# Patient Record
Sex: Female | Born: 2000 | Hispanic: Yes | Smoking: Never smoker
Health system: Southern US, Community
[De-identification: ages and names within clinical notes are randomized; demographics above are authoritative.]

## PROBLEM LIST (undated history)

## (undated) HISTORY — PX: OTHER SURGICAL HISTORY: SHX169

---
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
## 2021-05-15 IMAGING — US US OB LIMITED
2 series · 14 of 28 positions shown · non-contrast
Comparison: none

CLINICAL DATA: Fall, abdominal pain, 23 weeks pregnant.

EXAM:
LIMITED OBSTETRIC ULTRASOUND

[Series 1: us ob limited · 8 of 21 slices shown (1 of 2)]
[im 2/21]
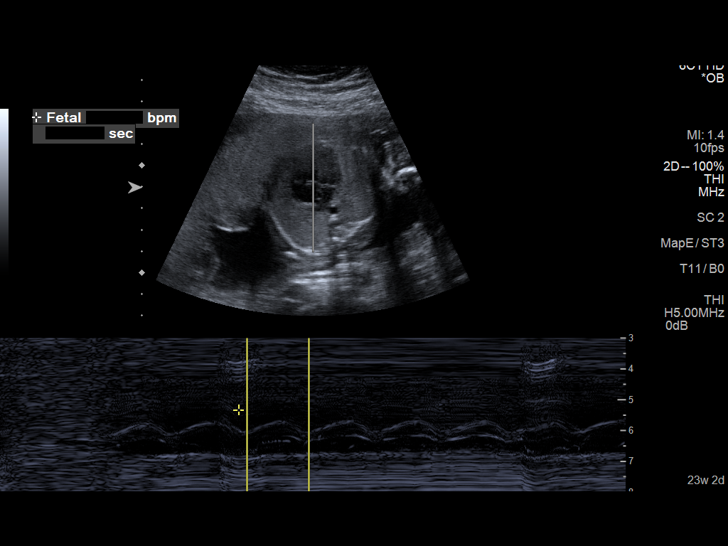
[im 4/21]
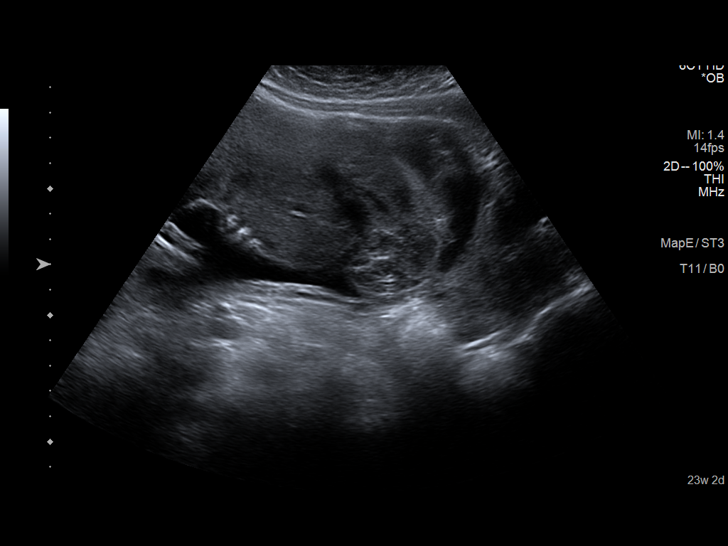
[im 7/21]
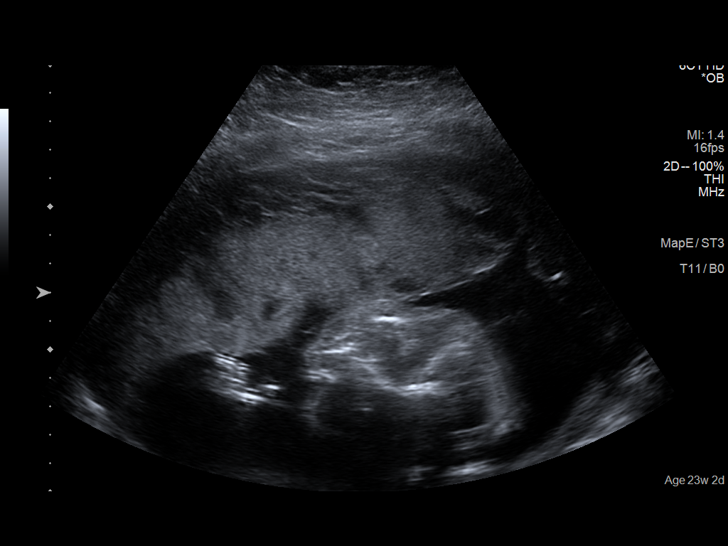
[im 9/21]
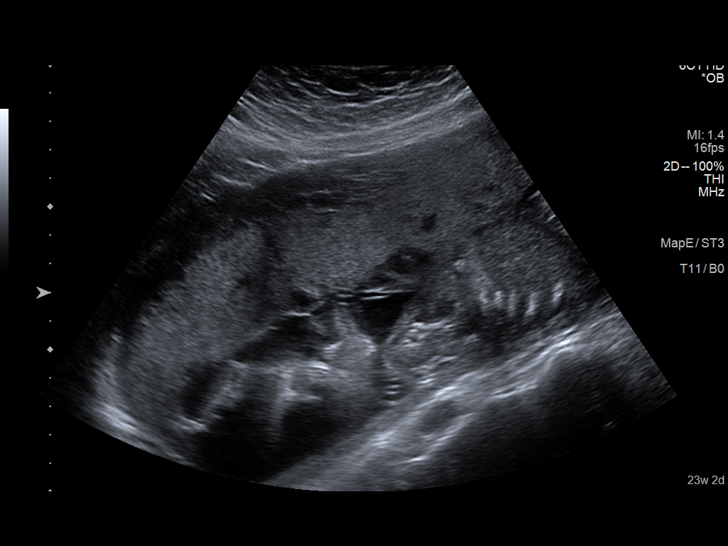
[im 12/21]
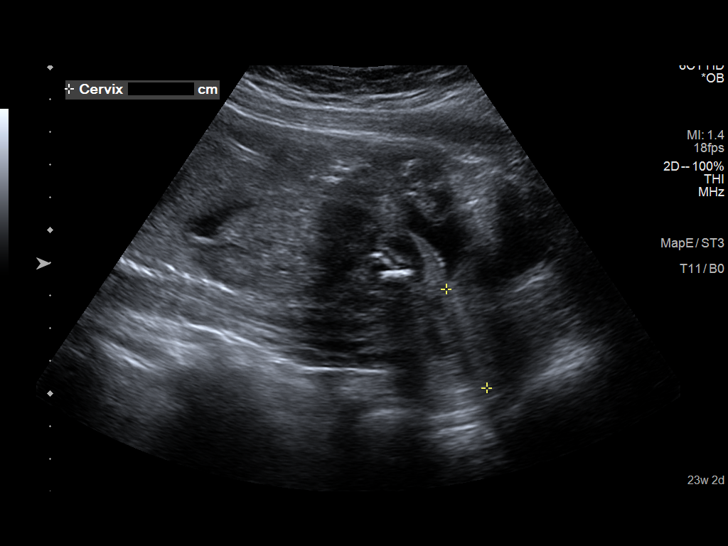
[im 14/21]
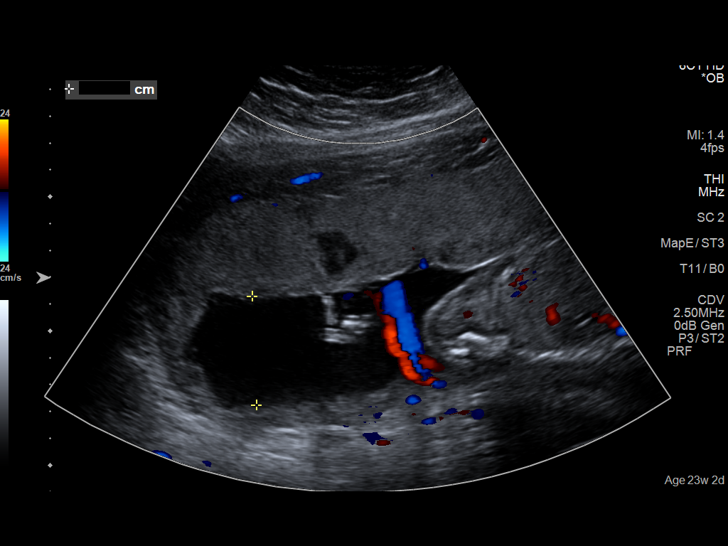
[im 17/21]
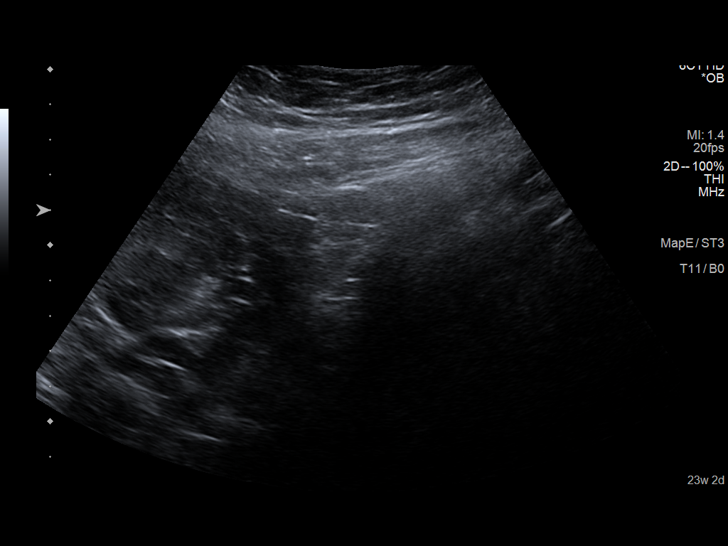
[im 19/21]
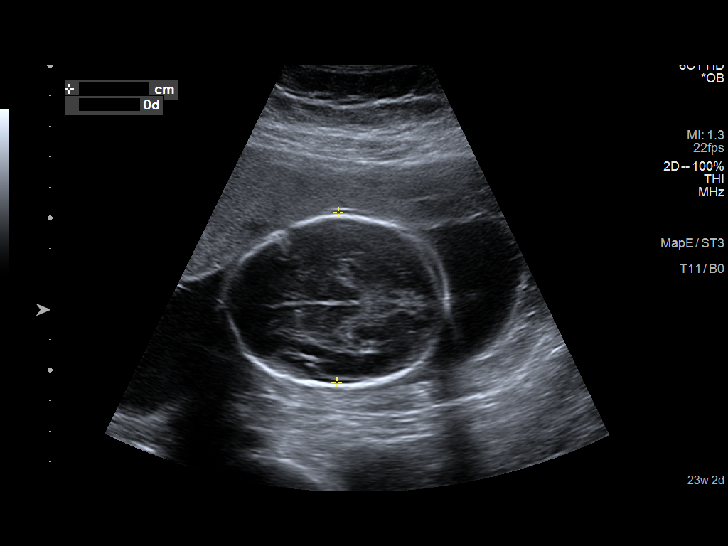

[Series 1001: us ob limited · 0.23mm/px · 6 of 14 slices shown (2 of 2)]
[im 1/14]
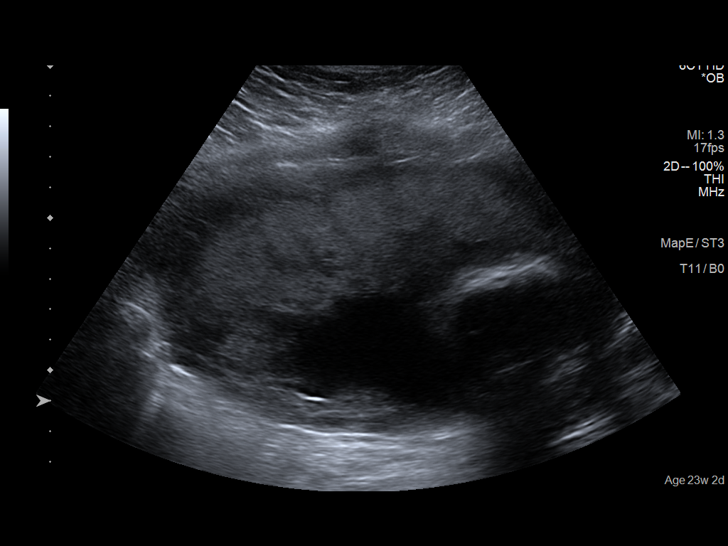
[im 3/14]
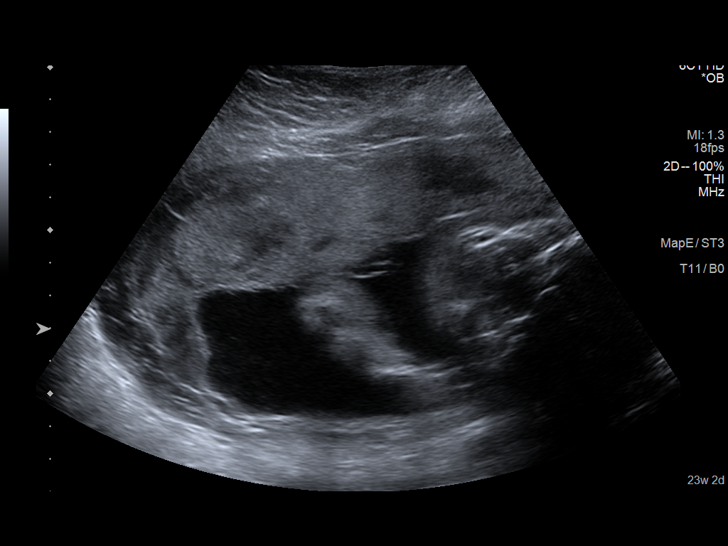
[im 6/14]
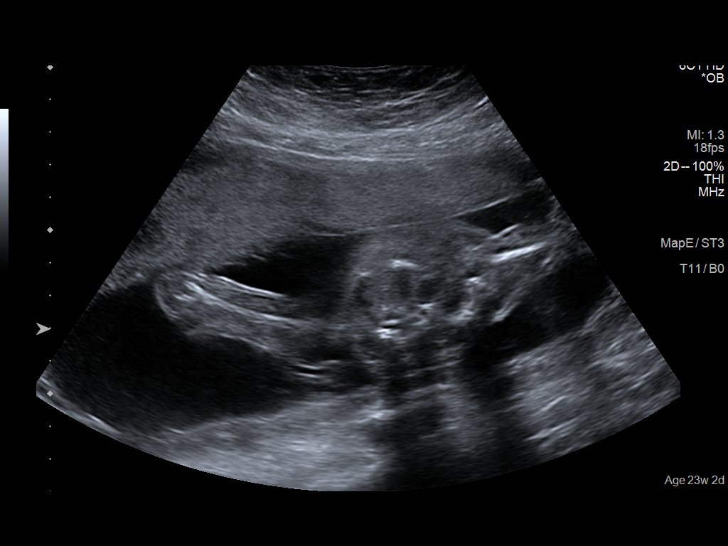
[im 8/14]
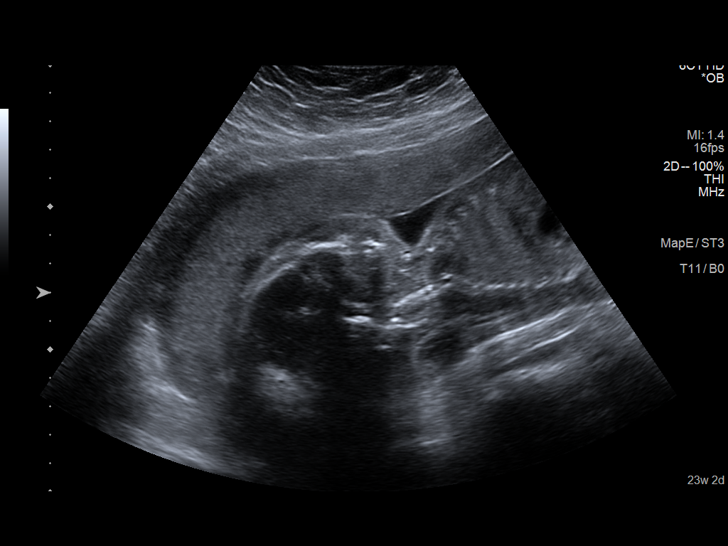
[im 11/14]
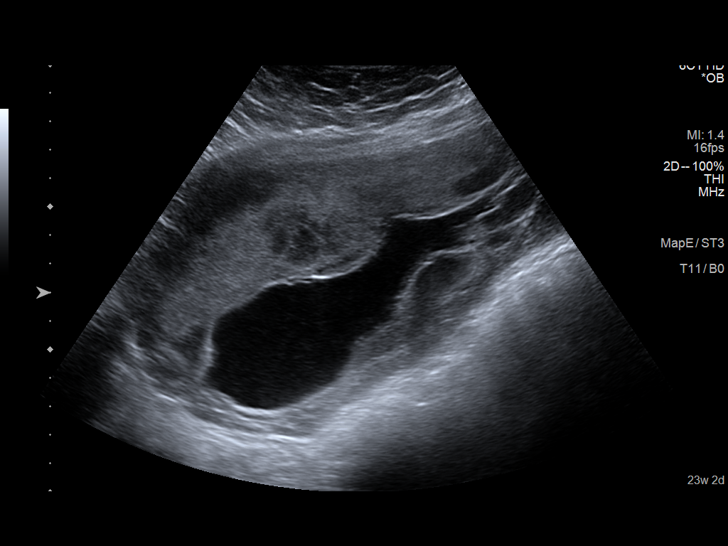
[im 14/14]
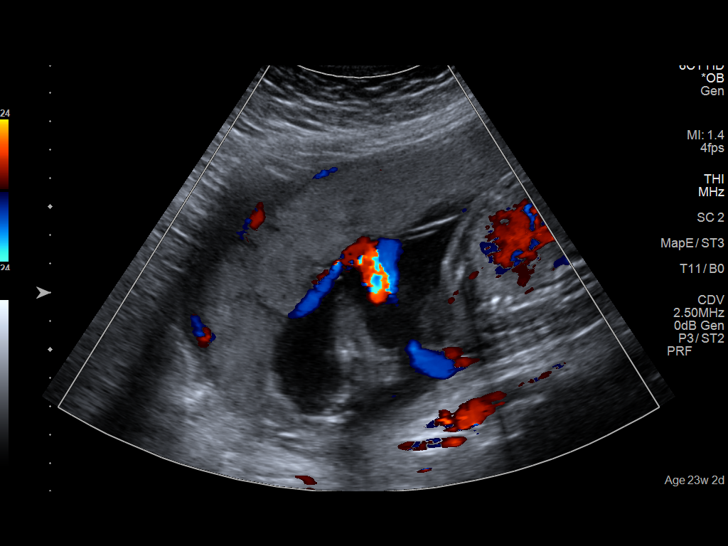

[14 of 28 positions shown; findings below may reference images not displayed]

FINDINGS: Number of Fetuses: 1

Heart Rate:  165 bpm

Movement: Present

Presentation: Breech

Placental Location: Anterior

Previa: None

Amniotic Fluid (Subjective):  Within normal limits.

AFI: Not recorded cm

BPD: 5.6 cm 23 w  1 d

MATERNAL FINDINGS:

Cervix:  Closed measuring 3.3 cm

Uterus/Adnexae: No gross abnormality
IMPRESSION: Normal limited OB exam.

This exam is performed on an emergent basis and does not
comprehensively evaluate fetal size, dating, or anatomy; follow-up
complete OB US should be considered if further fetal assessment is
warranted.

## 2022-08-12 ENCOUNTER — Other Ambulatory Visit: Payer: Self-pay | Admitting: Primary Care

## 2022-08-12 ENCOUNTER — Other Ambulatory Visit (HOSPITAL_COMMUNITY): Payer: Self-pay | Admitting: Primary Care

## 2022-08-12 DIAGNOSIS — Z3481 Encounter for supervision of other normal pregnancy, first trimester: Secondary | ICD-10-CM

## 2022-08-25 ENCOUNTER — Other Ambulatory Visit: Payer: Self-pay | Admitting: Primary Care

## 2022-08-25 ENCOUNTER — Ambulatory Visit
Admission: RE | Admit: 2022-08-25 | Discharge: 2022-08-25 | Disposition: A | Discharge status: Home / Self Care | Payer: Medicaid Other | Source: Ambulatory Visit | Attending: Primary Care | Admitting: Primary Care

## 2022-08-25 DIAGNOSIS — Z3687 Encounter for antenatal screening for uncertain dates: Secondary | ICD-10-CM | POA: Insufficient documentation

## 2022-08-25 DIAGNOSIS — Z3481 Encounter for supervision of other normal pregnancy, first trimester: Secondary | ICD-10-CM | POA: Insufficient documentation

## 2022-08-25 DIAGNOSIS — Z3A13 13 weeks gestation of pregnancy: Secondary | ICD-10-CM | POA: Diagnosis not present

## 2022-09-09 ENCOUNTER — Other Ambulatory Visit: Payer: Self-pay | Admitting: Primary Care

## 2022-09-09 DIAGNOSIS — Z3482 Encounter for supervision of other normal pregnancy, second trimester: Secondary | ICD-10-CM

## 2022-09-17 ENCOUNTER — Ambulatory Visit: Payer: Medicaid Other

## 2022-10-15 ENCOUNTER — Ambulatory Visit
Admission: RE | Admit: 2022-10-15 | Discharge: 2022-10-15 | Disposition: A | Discharge status: Home / Self Care | Payer: Medicaid Other | Source: Ambulatory Visit | Attending: Primary Care | Admitting: Primary Care

## 2022-10-15 DIAGNOSIS — Z3689 Encounter for other specified antenatal screening: Secondary | ICD-10-CM | POA: Insufficient documentation

## 2022-10-15 DIAGNOSIS — Z3482 Encounter for supervision of other normal pregnancy, second trimester: Secondary | ICD-10-CM

## 2022-10-15 DIAGNOSIS — Z3A20 20 weeks gestation of pregnancy: Secondary | ICD-10-CM | POA: Diagnosis not present

## 2022-12-20 NOTE — L&D Delivery Note (Signed)
Delivery Note  First Stage: Labor onset: 0400 Augmentation : AROM Analgesia /Anesthesia intrapartum: epidural AROM at 1619  Second Stage: Complete dilation at 1850 Onset of pushing at 1852 FHR second stage Category II, recurrent variable decels  Delivery of a viable female infant 02/27/2023 at [place] by Lucrezia Europe, CNM. delivery of fetal head in OA position with restitution to LOT. No nuchal cord;  Anterior then posterior shoulders delivered easily with gentle downward traction. Baby placed on mom's chest, and attended to by peds.  Cord double clamped after cessation of pulsation, cut by FOB Cord blood sample collected   Third Stage: Retained placenta. Attempted oxytocin bolus then stopped the oxytocin bolus. Unable to deliver the placenta with gentle traction or a fundal rub. At 30 minutes without placenta delivery, called Dr. Glennon Mac and requested he come evaluate for manual removal of the placenta. Placenta delivered manually intact by Dr. Glennon Mac with 3 VC @ 2004. Dr. Glennon Mac performed bedside US after manual removal. Placenta disposition: sent to pathology d/t manual removal Uterine tone firm / bleeding moderate then light. Oxytocin bolus initiated and cytotec placed rectally.  1st degree laceration identified  Anesthesia for repair: n/a Repair: none needed, laceration hemostatic and approximated Est. Blood Loss (mL): [id number]  Complications: retained placenta  Mom to postpartum.  Baby to Couplet care / Skin to Skin.  Newborn: Birth Weight: 8lb 2.2oz  Apgar Scores: 9, 9 Feeding planned: breast and bottle

## 2023-02-27 ENCOUNTER — Inpatient Hospital Stay: Payer: Medicaid Other | Admitting: Anesthesiology

## 2023-02-27 ENCOUNTER — Other Ambulatory Visit: Payer: Self-pay

## 2023-02-27 ENCOUNTER — Inpatient Hospital Stay
Admission: EM | Admit: 2023-02-27 | Discharge: 2023-03-01 | DRG: 797 | Disposition: A | Discharge status: Home / Self Care | Payer: Medicaid Other

## 2023-02-27 DIAGNOSIS — O9902 Anemia complicating childbirth: Secondary | ICD-10-CM | POA: Diagnosis present

## 2023-02-27 DIAGNOSIS — Z3A39 39 weeks gestation of pregnancy: Secondary | ICD-10-CM | POA: Diagnosis not present

## 2023-02-27 DIAGNOSIS — O99214 Obesity complicating childbirth: Secondary | ICD-10-CM | POA: Diagnosis present

## 2023-02-27 DIAGNOSIS — D62 Acute posthemorrhagic anemia: Secondary | ICD-10-CM | POA: Diagnosis not present

## 2023-02-27 DIAGNOSIS — O479 False labor, unspecified: Secondary | ICD-10-CM | POA: Diagnosis present

## 2023-02-27 DIAGNOSIS — O26893 Other specified pregnancy related conditions, third trimester: Secondary | ICD-10-CM | POA: Diagnosis present

## 2023-02-27 LAB — CBC
HCT: 34.4 % — ABNORMAL LOW (ref 36.0–46.0)
Hemoglobin: 11.2 g/dL — ABNORMAL LOW (ref 12.0–15.0)
MCH: 26.5 pg (ref 26.0–34.0)
MCHC: 32.6 g/dL (ref 30.0–36.0)
MCV: 81.3 fL (ref 80.0–100.0)
Platelets: 210 10*3/uL (ref 150–400)
RBC: 4.23 MIL/uL (ref 3.87–5.11)
RDW: 14.6 % (ref 11.5–15.5)
WBC: 9.4 10*3/uL (ref 4.0–10.5)
nRBC: 0 % (ref 0.0–0.2)

## 2023-02-27 LAB — TYPE AND SCREEN
ABO/RH(D): O POS
Antibody Screen: NEGATIVE

## 2023-02-27 MED ORDER — EPHEDRINE 5 MG/ML INJ: 10.0000 mg | INTRAVENOUS | Status: DC | PRN

## 2023-02-27 MED ORDER — FENTANYL CITRATE (PF) 100 MCG/2ML IJ SOLN: 50.0000 ug | INTRAMUSCULAR | Status: DC | PRN

## 2023-02-27 MED ORDER — OXYTOCIN-SODIUM CHLORIDE 30-0.9 UT/500ML-% IV SOLN
2.5000 [IU]/h | INTRAVENOUS | Status: DC
  Filled 2023-02-27: qty 500

## 2023-02-27 MED ORDER — ONDANSETRON HCL 4 MG PO TABS: 4.0000 mg | ORAL_TABLET | ORAL | Status: DC | PRN

## 2023-02-27 MED ORDER — ONDANSETRON HCL 4 MG/2ML IJ SOLN: 4.0000 mg | INTRAMUSCULAR | Status: DC | PRN

## 2023-02-27 MED ORDER — DIPHENHYDRAMINE HCL 50 MG/ML IJ SOLN: 12.5000 mg | INTRAMUSCULAR | Status: DC | PRN

## 2023-02-27 MED ORDER — LACTATED RINGERS IV SOLN: INTRAVENOUS | Status: DC

## 2023-02-27 MED ORDER — ACETAMINOPHEN 325 MG PO TABS: 650.0000 mg | ORAL_TABLET | ORAL | Status: DC | PRN

## 2023-02-27 MED ORDER — BENZOCAINE-MENTHOL 20-0.5 % EX AERO
1.0000 | INHALATION_SPRAY | CUTANEOUS | Status: DC | PRN
  Administered 2023-02-27: 1 via TOPICAL
  Filled 2023-02-27: qty 56

## 2023-02-27 MED ORDER — WITCH HAZEL-GLYCERIN EX PADS
1.0000 | MEDICATED_PAD | CUTANEOUS | Status: DC | PRN
  Administered 2023-02-27: 1 via TOPICAL
  Filled 2023-02-27: qty 100

## 2023-02-27 MED ORDER — PRENATAL MULTIVITAMIN CH
1.0000 | ORAL_TABLET | Freq: Every day | ORAL | Status: DC
  Administered 2023-02-28 – 2023-03-01 (×2): 1 via ORAL
  Filled 2023-02-27 (×2): qty 1

## 2023-02-27 MED ORDER — OXYTOCIN 10 UNIT/ML IJ SOLN
INTRAMUSCULAR | Status: AC
  Filled 2023-02-27: qty 2

## 2023-02-27 MED ORDER — MISOPROSTOL 200 MCG PO TABS
ORAL_TABLET | ORAL | Status: AC
  Filled 2023-02-27: qty 4

## 2023-02-27 MED ORDER — MISOPROSTOL 200 MCG PO TABS
800.0000 ug | ORAL_TABLET | Freq: Once | ORAL | Status: AC
  Administered 2023-02-27: 800 ug via RECTAL

## 2023-02-27 MED ORDER — FENTANYL-BUPIVACAINE-NACL 0.5-0.125-0.9 MG/250ML-% EP SOLN
EPIDURAL | Status: AC
  Filled 2023-02-27: qty 250

## 2023-02-27 MED ORDER — ZOLPIDEM TARTRATE 5 MG PO TABS: 5.0000 mg | ORAL_TABLET | Freq: Every evening | ORAL | Status: DC | PRN

## 2023-02-27 MED ORDER — PHENYLEPHRINE 80 MCG/ML (10ML) SYRINGE FOR IV PUSH (FOR BLOOD PRESSURE SUPPORT): 80.0000 ug | PREFILLED_SYRINGE | INTRAVENOUS | Status: DC | PRN

## 2023-02-27 MED ORDER — SOD CITRATE-CITRIC ACID 500-334 MG/5ML PO SOLN: 30.0000 mL | ORAL | Status: DC | PRN

## 2023-02-27 MED ORDER — OXYTOCIN BOLUS FROM INFUSION
333.0000 mL | Freq: Once | INTRAVENOUS | Status: AC
  Administered 2023-02-27: 333 mL via INTRAVENOUS

## 2023-02-27 MED ORDER — IBUPROFEN 600 MG PO TABS
600.0000 mg | ORAL_TABLET | Freq: Four times a day (QID) | ORAL | Status: DC
  Administered 2023-02-27 – 2023-03-01 (×6): 600 mg via ORAL
  Filled 2023-02-27 (×6): qty 1

## 2023-02-27 MED ORDER — AMMONIA AROMATIC IN INHA
RESPIRATORY_TRACT | Status: AC
  Filled 2023-02-27: qty 10

## 2023-02-27 MED ORDER — LIDOCAINE HCL (PF) 1 % IJ SOLN: 30.0000 mL | INTRAMUSCULAR | Status: DC | PRN

## 2023-02-27 MED ORDER — LIDOCAINE HCL (PF) 1 % IJ SOLN
INTRAMUSCULAR | Status: AC
  Filled 2023-02-27: qty 30

## 2023-02-27 MED ORDER — DIBUCAINE (PERIANAL) 1 % EX OINT: 1.0000 | TOPICAL_OINTMENT | CUTANEOUS | Status: DC | PRN

## 2023-02-27 MED ORDER — LACTATED RINGERS IV SOLN: 500.0000 mL | INTRAVENOUS | Status: DC | PRN

## 2023-02-27 MED ORDER — LIDOCAINE HCL (PF) 1 % IJ SOLN
INTRAMUSCULAR | Status: DC | PRN
  Administered 2023-02-27: 3 mL via SUBCUTANEOUS

## 2023-02-27 MED ORDER — COCONUT OIL OIL: 1.0000 | TOPICAL_OIL | Status: DC | PRN

## 2023-02-27 MED ORDER — SODIUM CHLORIDE 0.9 % IV SOLN
INTRAVENOUS | Status: DC | PRN
  Administered 2023-02-27: 5 mL via EPIDURAL
  Administered 2023-02-27: 2 mL via EPIDURAL

## 2023-02-27 MED ORDER — SIMETHICONE 80 MG PO CHEW: 80.0000 mg | CHEWABLE_TABLET | ORAL | Status: DC | PRN

## 2023-02-27 MED ORDER — ONDANSETRON HCL 4 MG/2ML IJ SOLN: 4.0000 mg | Freq: Four times a day (QID) | INTRAMUSCULAR | Status: DC | PRN

## 2023-02-27 MED ORDER — FENTANYL-BUPIVACAINE-NACL 0.5-0.125-0.9 MG/250ML-% EP SOLN
12.0000 mL/h | EPIDURAL | Status: DC | PRN
  Administered 2023-02-27: 12 mL/h via EPIDURAL

## 2023-02-27 MED ORDER — DIPHENHYDRAMINE HCL 25 MG PO CAPS: 25.0000 mg | ORAL_CAPSULE | Freq: Four times a day (QID) | ORAL | Status: DC | PRN

## 2023-02-27 MED ORDER — LACTATED RINGERS IV SOLN
500.0000 mL | Freq: Once | INTRAVENOUS | Status: AC
  Administered 2023-02-27: 500 mL via INTRAVENOUS

## 2023-02-27 MED ORDER — SENNOSIDES-DOCUSATE SODIUM 8.6-50 MG PO TABS
2.0000 | ORAL_TABLET | Freq: Every day | ORAL | Status: DC
  Administered 2023-02-28 – 2023-03-01 (×2): 2 via ORAL
  Filled 2023-02-27 (×2): qty 2

## 2023-02-27 MED ORDER — LIDOCAINE-EPINEPHRINE (PF) 1.5 %-1:200000 IJ SOLN
INTRAMUSCULAR | Status: DC | PRN
  Administered 2023-02-27: 3 mL via EPIDURAL
  Administered 2023-02-27: 2 mL via EPIDURAL

## 2023-02-27 NOTE — Progress Notes (Signed)
Called by midwife due to retained placenta.  Oxytocin already running.  Epidural bolused.   Minimal bleeding on exam. Some placenta in vaginal vault.  Explained to patient need to manually remove placenta. She voiced understanding and agreement. Right hand inserted into uterus.  Placenta palpated anterior and plan easily created.  Bandl's band noted. Placenta partially delivered through band and then hand extended a little further to remove rest of placenta.  Entire placenta removed at this point.   Initially inspection of placenta showed that it appeared intact.  Bedside ultrasound showed thin endometrial lining with some clots in lower uterine segment/cervix.  Hand placed again in lower uterine segment with only small clot removed. Entire uterus again swept with hand and no placenta palpated. Contour palpated normal without irregularities.   Given minimal lochia and no evidence of further retained placenta, terminated procedure.  Misoprostol 800 mcg placed rectally after discussing reason with patient and obtaining consent.   Inspected the placenta again and it appeared intact, though ripped in parts, but able to reassemble (entire placenta in one piece, but separated).   Patient doing well and only a small first degree noted, which will be repaired by Jayme Cloud, as indicated.  Prentice Docker, MD, [facility] 02/27/2023 8:18 PM

## 2023-02-27 NOTE — Discharge Summary (Signed)
Obstetrical Discharge Summary  Patient Name: Natalie Munoz DOB: Apr 05, 2001 MRN: [id number]  Date of Admission: 02/27/2023 Date of Delivery: 02/27/23 Delivered by: Natalie Munoz Date of Discharge: 03/01/2023  Primary OB: Natalie Munoz  LMP:No LMP recorded. EDC Estimated Date of Delivery: 03/02/23 Gestational Age at Delivery: [redacted]w[redacted]d   Antepartum complications:  1. Obesity 2. Anemia 3. Transfer from Natalie Munoz Admitting Diagnosis: uterine contractions Secondary Diagnosis: NSVD, retained placenta Patient Active Problem List   Diagnosis Date Noted   Uterine contractions 02/27/2023   NSVD (normal spontaneous vaginal delivery) 02/27/2023   Retained placenta 02/27/2023   Irregular uterine contractions 01/13/2020   Leakage of amniotic fluid 01/13/2020   Rupture of membranes with delay of delivery 01/13/2020    Augmentation: AROM Complications: None Intrapartum complications/course: She arrived with painful uterine contractions and was 7/90/-1. She received an epidural and AROM and progressed to 10/100/+3 and pushed 4 minutes, delivering viable female infant over 1st degree laceration. Retained placenta x , removed manually by Natalie Munoz. Date of Delivery: 02/27/23 Delivered By: Natalie Munoz Delivery Type: spontaneous vaginal delivery Anesthesia: epidural Placenta: spontaneous Laceration: 1st degree, hemostatic and approximated, no need for repair Episiotomy: none Newborn Data: Live born female "Natalie Munoz" Birth Weight: 8 lb 2.2 oz (3690 g) APGAR: 9, 9  Newborn Delivery   Birth date/time: 02/27/2023 18:56:00 Delivery type: Vaginal, Spontaneous      Postpartum Procedures: None Edinburgh:     02/27/2023   10:50 PM 01/14/2020    3:13 AM  Edinburgh Postnatal Depression Scale Screening Tool  I have been able to laugh and see the funny side of things. 0 0  I have looked forward with enjoyment to things. 0 0  I have blamed myself unnecessarily  when things went wrong. 1 2  I have been anxious or worried for no good reason. 0 1  I have felt scared or panicky for no good reason. 0 1  Things have been getting on top of me. 0 1  I have been so unhappy that I have had difficulty sleeping. 0 0  I have felt sad or miserable. 0 2  I have been so unhappy that I have been crying. 0 2  The thought of harming myself has occurred to me. 0 0  Edinburgh Postnatal Depression Scale Total 1 9      Post partum course:  Patient had an uncomplicated postpartum course.  By time of discharge on PPD#1, her pain was controlled on oral pain medications; she had appropriate lochia and was ambulating, voiding without difficulty and tolerating regular diet.  She was deemed stable for discharge to home.    Discharge Physical Exam:  BP 109/76   Pulse 93   Temp 98.3 F (36.8 C) (Oral)   Resp 20   Ht 5' (1.524 m)   Wt 94.3 kg   SpO2 97%   Breastfeeding Unknown   BMI 40.60 kg/m   General: NAD CV: RRR Pulm: CTABL, nl effort ABD: s/nd/nt, fundus firm and below the umbilicus Lochia: moderate Perineum: well approximated/intact DVT Evaluation: LE non-ttp, no evidence of DVT on exam.  Hemoglobin  Date Value Ref Range Status  02/28/2023 10.1 (L) 12.0 - 15.0 g/dL Final   HCT  Date Value Ref Range Status  02/28/2023 30.5 (L) 36.0 - 46.0 % Final     Disposition: stable, discharge to home. Baby Feeding: breastmilk and formula Baby Disposition: home with mom  Rh Immune globulin given: n/a, O pos Rubella vaccine given: n/a, immune Varicella  vaccine given: n/a, immune Tdap vaccine given in AP or PP setting: received prenatally Flu vaccine given in AP or PP setting: received prenatally  Contraception: Liletta IUD  Prenatal Labs:  Blood type/Rh O pos  Antibody screen neg  Rubella Immune  Varicella Immune  RPR NR  HBsAg Neg  HIV NR  GC neg  Chlamydia neg  Genetic screening negative  1 hour GTT 135  3 hour GTT [phone]   GBS  Negative     Plan:  Natalie Munoz was discharged to home in good condition. Follow-up appointment with delivering provider in 6 weeks.  Discharge Medications: Allergies as of 03/01/2023   No Known Allergies      Medication List     TAKE these medications    acetaminophen 500 MG tablet Commonly known as: TYLENOL Take 2 tablets (1,000 mg total) by mouth every 6 (six) hours as needed (for pain scale < 4).   docusate sodium 100 MG capsule Commonly known as: COLACE Take 1 capsule (100 mg total) by mouth 2 (two) times daily.   ferrous sulfate 325 (65 FE) MG tablet Take 1 tablet (325 mg total) by mouth 2 (two) times daily with a meal. For anemia, take with Vitamin C   ibuprofen 600 MG tablet Commonly known as: ADVIL Take 1 tablet (600 mg total) by mouth every 6 (six) hours.   Prenatal Vitamin 27-0.8 MG Tabs Take 1 tablet by mouth daily at 6 (six) AM.         Follow-up Information     Natalie Munoz, Munoz Follow up in 6 week(s).   Specialty: Certified Nurse Midwife Why: 6wk postpartum and IUD insertion Contact information: [address] [place] [address] [phone]                 Signed:  Al Munoz 03/01/2023 8:37 AM  Natalie Munoz, Munoz Certified Nurse Midwife [facility]

## 2023-02-27 NOTE — H&P (Signed)
OB History & Physical   History of Present Illness:  Chief Complaint:   HPI:  Natalie Munoz is a 22 y.o. G48P1001 female at 13w4ddated by LMP.  She presents to L&D for uterine contractions that started at 0400. She reports ? Leaking fluid for about one hour. Reports good fetal movement.   Pregnancy Issues: 1. Obesity 2. Anemia 3. [facility]:  No past medical history on file.  Past Surgical History:  Procedure Laterality Date   None      No Known Allergies  Prior to Admission medications   Medication Sig Start Date End Date Taking? Authorizing Provider  acetaminophen (TYLENOL) 500 MG tablet Take 2 tablets (1,000 mg total) by mouth every 6 (six) hours as needed (for pain scale < 4). 01/15/20   McVey, RMurray Hodgkins CNM  docusate sodium (COLACE) 100 MG capsule Take 1 capsule (100 mg total) by mouth 2 (two) times daily. 01/15/20   McVey, RMurray Hodgkins CNM  ferrous sulfate 325 (65 FE) MG tablet Take 1 tablet (325 mg total) by mouth 2 (two) times daily with a meal. For anemia, take with Vitamin C 01/15/20 03/15/20  McVey, RMurray Hodgkins CNM  ibuprofen (ADVIL) 600 MG tablet Take 1 tablet (600 mg total) by mouth every 6 (six) hours. 01/15/20   McVey, RMurray Hodgkins CNM  Prenatal Vit-Fe Fumarate-FA (PRENATAL VITAMIN) 27-0.8 MG TABS Take 1 tablet by mouth daily at 6 (six) AM. 06/19/19   NCaren Macadam MD    [place]  Social History: She  reports that she has never smoked. She has never used smokeless tobacco. She reports that she does not drink alcohol and does not use drugs.  Family History: family history includes Diabetes in her mother; Hypertension in her mother.   Review of Systems: A full review of systems was performed and negative except as noted in the HPI.     Physical Exam:  Vital Signs: BP 111/70   Pulse 98  General: no acute distress.  HEENT: normocephalic, atraumatic Heart: regular rate &  rhythm.  No murmurs/rubs/gallops Lungs: clear to auscultation bilaterally, normal respiratory effort Abdomen: soft, gravid, non-tender;  EFW: 7lb Pelvic:   External: Normal external female genitalia  Cervix: 7/90/-1   Extremities: non-tender, symmetric, mild edema bilaterally.  DTRs: +2  Neurologic: Alert & oriented x 3.    No results found for this or any previous visit (from the past 24 hour(s)).  Pertinent Results:  Prenatal Labs: Blood type/Rh O pos  Antibody screen neg  Rubella Immune  Varicella Immune  RPR NR  HBsAg Neg  HIV NR  GC neg  Chlamydia neg  Genetic screening negative  1 hour GTT 135  3 hour GTT [phone]  GBS Negative   FHT: 145bpm, moderate variability, accelerations, intermittent late decelerations TOCO: contractions q2-366m, palpate moderate SVE:  7/90/-1, BBOW palpated   Cephalic by leopolds  No results found.  Assessment:  Natalie Munoz a [age].o. G2G93P1001emale at 3942w4dth uterine contractions.   Plan:  1. Admit to Labor & Delivery; consents reviewed and obtained  2. Fetal Well being  - Fetal Tracing: Category II, intermittent late decelerations, resolved with position changes - Group B Streptococcus ppx indicated: n/a, GBS negative - Presentation: vertex confirmed by SVE   3. Routine OB: - Prenatal labs reviewed, as above - Rh positive - CBC, T&S, RPR on admit - Clear fluids, IVF  4. Monitoring  of Labor -  Contractions q2-38mn, external toco in place -  Pelvis proven to 3490g -  Plan for continuous fetal monitoring  -  Maternal pain control as desired; requesting regional anesthesia - Anticipate vaginal delivery  5. Post Partum Planning: - Infant feeding: breast and formula - Contraception: Liletta IUD  [name] CNM 02/27/23 2:16 PM

## 2023-02-27 NOTE — Anesthesia Procedure Notes (Signed)
Epidural Patient location during procedure: OB Start time: 02/27/2023 3:41 PM End time: 02/27/2023 3:50 PM  Staffing Anesthesiologist: Martha Clan, MD Performed: anesthesiologist   Preanesthetic Checklist Completed: patient identified, IV checked, site marked, risks and benefits discussed, surgical consent, monitors and equipment checked, pre-op evaluation and timeout performed  Epidural Patient position: sitting Prep: ChloraPrep Patient monitoring: heart rate, continuous pulse ox and blood pressure Approach: midline Location: L3-L4 Injection technique: LOR saline  Needle:  Needle type: Tuohy  Needle gauge: 17 G Needle length: 9 cm and 9 Needle insertion depth: 6 cm Catheter type: closed end flexible Catheter size: 19 Gauge Catheter at skin depth: 11 cm Test dose: negative and 1.5% lidocaine with Epi 1:200 K  Assessment Sensory level: T10 Events: blood not aspirated, no cerebrospinal fluid, injection not painful, no injection resistance, no paresthesia and negative IV test  Additional Notes 1st attempt Pt. Evaluated and documentation done after procedure finished. Patient identified. Risks/Benefits/Options discussed with patient including but not limited to bleeding, infection, nerve damage, paralysis, failed block, incomplete pain control, headache, blood pressure changes, nausea, vomiting, reactions to medication both or allergic, itching and postpartum back pain. Confirmed with bedside nurse the patient's most recent platelet count. Confirmed with patient that they are not currently taking any anticoagulation, have any bleeding history or any family history of bleeding disorders. Patient expressed understanding and wished to proceed. All questions were answered. Sterile technique was used throughout the entire procedure. Please see nursing notes for vital signs. Test dose was given through epidural catheter and negative prior to continuing to dose epidural or start infusion.  Warning signs of high block given to the patient including shortness of breath, tingling/numbness in hands, complete motor block, or any concerning symptoms with instructions to call for help. Patient was given instructions on fall risk and not to get out of bed. All questions and concerns addressed with instructions to call with any issues or inadequate analgesia.    Patient tolerated the insertion well without immediate complications.Reason for block:procedure for pain

## 2023-02-27 NOTE — Anesthesia Preprocedure Evaluation (Addendum)
Anesthesia Evaluation  Patient identified by MRN, date of birth, ID band Patient awake    Reviewed: Allergy & Precautions, H&P , NPO status , Patient's Chart, lab work & pertinent test results  Airway Mallampati: III  TM Distance: >3 FB Neck ROM: full    Dental  (+) Chipped   Pulmonary neg pulmonary ROS          Cardiovascular Exercise Tolerance: Good (-) hypertensionnegative cardio ROS      Neuro/Psych    GI/Hepatic negative GI ROS,,,  Endo/Other    Renal/GU   negative genitourinary   Musculoskeletal   Abdominal   Peds  Hematology negative hematology ROS (+)   Anesthesia Other Findings History reviewed. No pertinent past medical history.  Past Surgical History: No date: None  BMI    Body Mass Index: 32.69 kg/m      Reproductive/Obstetrics (+) Pregnancy                             Anesthesia Physical Anesthesia Plan  ASA: 2  Anesthesia Plan: Epidural   Post-op Pain Management:    Induction:   PONV Risk Score and Plan:   Airway Management Planned:   Additional Equipment:   Intra-op Plan:   Post-operative Plan:   Informed Consent: I have reviewed the patients History and Physical, chart, labs and discussed the procedure including the risks, benefits and alternatives for the proposed anesthesia with the patient or authorized representative who has indicated his/her understanding and acceptance.       Plan Discussed with: Anesthesiologist  Anesthesia Plan Comments:         Anesthesia Quick Evaluation

## 2023-02-27 NOTE — Progress Notes (Signed)
Labor Progress Note  Natalie Munoz is a 22 y.o. G2P1001 at 34w4dby LMP admitted for active labor  Subjective: she is comfortable after her epidural, feels occasional rectal pressure  Objective: BP 99/64   Pulse 88   SpO2 100%  Notable VS details: reviewed  Fetal Assessment: FHT:  FHR: 135 bpm, variability: moderate,  accelerations:  Present,  decelerations:  Present recurrent variable decelerations with quick recovery Category/reactivity:  Category II UC:   regular, every 3 minutes SVE:    Dilation: 9.5cm  Effacement: 100%  Station:  +1  Consistency: soft  Position: anterior  Membrane status:AROM @ [id number] color: clear  Labs: Lab Results  Component Value Date   WBC 9.4 02/27/2023   HGB 11.2 (L) 02/27/2023   HCT 34.4 (L) 02/27/2023   MCV 81.3 02/27/2023   PLT 210 02/27/2023    Assessment / Plan: G2P1001 at 344w4dere with spontaneous labor  Labor:  Progressing normally Preeclampsia:  labs stable Fetal Wellbeing:  Category II Pain Control:  Epidural I/D:   GBS negative Anticipated MOD:  NSVD  DaGertie FeyCNM 02/27/2023, 6:44 PM

## 2023-02-28 LAB — CBC
HCT: 30.5 % — ABNORMAL LOW (ref 36.0–46.0)
Hemoglobin: 10.1 g/dL — ABNORMAL LOW (ref 12.0–15.0)
MCH: 26.6 pg (ref 26.0–34.0)
MCHC: 33.1 g/dL (ref 30.0–36.0)
MCV: 80.5 fL (ref 80.0–100.0)
Platelets: 203 10*3/uL (ref 150–400)
RBC: 3.79 MIL/uL — ABNORMAL LOW (ref 3.87–5.11)
RDW: 14.6 % (ref 11.5–15.5)
WBC: 11.1 10*3/uL — ABNORMAL HIGH (ref 4.0–10.5)
nRBC: 0 % (ref 0.0–0.2)

## 2023-02-28 LAB — RPR: RPR Ser Ql: NONREACTIVE

## 2023-02-28 MED ORDER — FERROUS SULFATE 325 (65 FE) MG PO TABS
325.0000 mg | ORAL_TABLET | Freq: Two times a day (BID) | ORAL | Status: DC
  Administered 2023-02-28 – 2023-03-01 (×2): 325 mg via ORAL
  Filled 2023-02-28 (×2): qty 1

## 2023-02-28 NOTE — Anesthesia Postprocedure Evaluation (Signed)
Anesthesia Post Note  Patient: Natalie Munoz  Procedure(s) Performed: AN AD HOC LABOR EPIDURAL  Patient location during evaluation: Mother Baby Anesthesia Type: Epidural Level of consciousness: awake and alert Pain management: pain level controlled Vital Signs Assessment: post-procedure vital signs reviewed and stable Respiratory status: spontaneous breathing, nonlabored ventilation and respiratory function stable Cardiovascular status: stable Postop Assessment: no headache, no backache and epidural receding Anesthetic complications: no  No notable events documented.   Last Vitals:  Vitals:   02/27/23 2350 02/28/23 0323  BP: 108/69 108/79  Pulse: (!) 104 84  Resp: 20 18  Temp: 37.4 C 37 C  SpO2: 98% 99%    Last Pain:  Vitals:   02/28/23 0423  TempSrc:   PainSc: 0-No pain                 Maleeya Peterkin Lily Peer

## 2023-02-28 NOTE — Progress Notes (Signed)
Post Partum Day 1  Subjective: Doing well, no concerns. Ambulating without difficulty, pain managed with PO meds, tolerating regular diet, and voiding without difficulty.   No fever/chills, chest pain, shortness of breath, nausea/vomiting, or leg pain. No nipple or breast pain. No headache, visual changes, or RUQ/epigastric pain.  Objective: BP 108/79 (BP Location: Right Arm)   Pulse 84   Temp 98.6 F (37 C) (Oral)   Resp 18   Ht 5' (1.524 m)   Wt 94.3 kg   SpO2 99%   Breastfeeding Unknown   BMI 40.60 kg/m    Physical Exam:  General: alert and cooperative Breasts: soft/nontender CV: RRR Pulm: nl effort Abdomen: soft, non-tender Uterine Fundus: firm Incision: n/a Perineum: minimal edema, lacerations well approximated Lochia: appropriate DVT Evaluation: No evidence of DVT seen on physical exam. Edinburgh:     02/27/2023   10:50 PM 01/14/2020    3:13 AM  Edinburgh Postnatal Depression Scale Screening Tool  I have been able to laugh and see the funny side of things. 0 0  I have looked forward with enjoyment to things. 0 0  I have blamed myself unnecessarily when things went wrong. 1 2  I have been anxious or worried for no good reason. 0 1  I have felt scared or panicky for no good reason. 0 1  Things have been getting on top of me. 0 1  I have been so unhappy that I have had difficulty sleeping. 0 0  I have felt sad or miserable. 0 2  I have been so unhappy that I have been crying. 0 2  The thought of harming myself has occurred to me. 0 0  Edinburgh Postnatal Depression Scale Total 1 9     Recent Labs    02/27/23 1458 02/28/23 0508  HGB 11.2* 10.1*  HCT 34.4* 30.5*  WBC 9.4 11.1*  PLT 210 203    Assessment/Plan: 22 y.o. G2P2002 postpartum day # 1  -Continue routine postpartum care -Lactation consult PRN for breastfeeding   -Acute blood loss anemia - hemodynamically stable and asymptomatic; start PO ferrous sulfate BID with stool softeners  -Immunization  status: all immunizations up to date  Disposition: Continue inpatient postpartum care    LOS: 1 day   Kosha Jaquith, CNM 02/28/2023, 9:21 AM

## 2023-02-28 NOTE — Lactation Note (Signed)
This note was copied from a baby's chart. Lactation Consultation Note  Patient Name: Natalie Munoz [id number] Date: 02/28/2023 Age:21 hours Reason for consult: Initial assessment;1st time breastfeeding;Term   Maternal Data This is mom's 2nd baby, SVD. Mom with history of obesity and anemia. Per mom this is her first time breastfeeding. Her plan is to breastfeed and formula feed . Has patient been taught Hand Expression?: Yes Does the patient have breastfeeding experience prior to this delivery?: No  Feeding Mother's Current Feeding Choice: Breast Milk and Formula Per mom she offer the baby to breastfeed and follows with a bottle. The recent botttle she fed the baby 30 ml's. Per mom baby latches and suckles , comes off the breast fussy, per mom "because I have no milk." LC unable to assess breastfeed as baby recently fed taking 1 ounce. Natural Steps number on white board if mom would like [place] assistance. Interventions Interventions: Breast feeding basics reviewed;Education Reviewed how the body knows to make milk and the importance of consistent breastfeeding to establish a milk supply. Recommended mom massage her breast during the breastfeeding to keep baby interested by encouraging potential flow at the breast with massaging the breast.Discussed what to expect the first days when breastfeeding including the number of feeds in 24 hours, output, cluster feeding, and feeding cues.  Discharge Discharge Education: Engorgement and breast care Pump: Manual;Personal  Consult Status Consult Status: Follow-up Date: 03/01/23 Follow-up type: In-patient  Update provided to care nurse.  Natalie Munoz 02/28/2023, 4:56 PM

## 2023-03-01 MED ORDER — IBUPROFEN 600 MG PO TABS
600.0000 mg | ORAL_TABLET | Freq: Four times a day (QID) | ORAL | Status: DC
  Administered 2023-03-01: 600 mg via ORAL
  Filled 2023-03-01: qty 1

## 2023-03-01 NOTE — Discharge Instructions (Signed)
Vaginal Delivery, Care After Refer to this sheet in the next few weeks. These discharge instructions provide you with information on caring for yourself after delivery. Your caregiver may also give you specific instructions. Your treatment has been planned according to the most current medical practices available, but problems sometimes occur. Call your caregiver if you have any problems or questions after you go home. HOME CARE INSTRUCTIONS Take over-the-counter or prescription medicines only as directed by your caregiver or pharmacist. Do not drink alcohol, especially if you are breastfeeding or taking medicine to relieve pain. Do not smoke tobacco. Continue to use good perineal care. Good perineal care includes: Wiping your perineum from back to front Keeping your perineum clean. You can do sitz baths twice a day, to help keep this area clean Do not use tampons, douche or have sex until your caregiver says it is okay. Shower only and avoid sitting in submerged water, aside from sitz baths Wear a well-fitting bra that provides breast support. Eat healthy foods. Drink enough fluids to keep your urine clear or pale yellow. Eat high-fiber foods such as whole grain cereals and breads, brown rice, beans, and fresh fruits and vegetables every day. These foods may help prevent or relieve constipation. Avoid constipation with high fiber foods or medications, such as miralax or metamucil Follow your caregiver's recommendations regarding resumption of activities such as climbing stairs, driving, lifting, exercising, or traveling. Talk to your caregiver about resuming sexual activities. Resumption of sexual activities is dependent upon your risk of infection, your rate of healing, and your comfort and desire to resume sexual activity. Try to have someone help you with your household activities and your newborn for at least a few days after you leave the hospital. Rest as much as possible. Try to rest or  take a nap when your newborn is sleeping. Increase your activities gradually. Keep all of your scheduled postpartum appointments. It is very important to keep your scheduled follow-up appointments. At these appointments, your caregiver will be checking to make sure that you are healing physically and emotionally. SEEK MEDICAL CARE IF:  You are passing large clots from your vagina. Save any clots to show your caregiver. You have a foul smelling discharge from your vagina. You have trouble urinating. You are urinating frequently. You have pain when you urinate. You have a change in your bowel movements. You have increasing redness, pain, or swelling near your vaginal incision (episiotomy) or vaginal tear. You have pus draining from your episiotomy or vaginal tear. Your episiotomy or vaginal tear is separating. You have painful, hard, or reddened breasts. You have a severe headache. You have blurred vision or see spots. You feel sad or depressed. You have thoughts of hurting yourself or your newborn. You have questions about your care, the care of your newborn, or medicines. You are dizzy or light-headed. You have a rash. You have nausea or vomiting. You were breastfeeding and have not had a menstrual period within 12 weeks after you stopped breastfeeding. You are not breastfeeding and have not had a menstrual period by the 12th week after delivery. You have a fever. SEEK IMMEDIATE MEDICAL CARE IF:  You have persistent pain. You have chest pain. You have shortness of breath. You faint. You have leg pain. You have stomach pain. Your vaginal bleeding saturates two or more sanitary pads in 1 hour. MAKE SURE YOU:  Understand these instructions. Will watch your condition. Will get help right away if you are not doing well or   get worse. Document Released: 12/03/2000 Document Revised: 04/22/2014 Document Reviewed: 08/02/2012 Exit[employer] Patient Information 2015 [employer]. This  information is not intended to replace advice given to you by your health care provider. Make sure you discuss any questions you have with your health care provider.  Sitz Bath A sitz bath is a warm water bath taken in the sitting position. The water covers only the hips and butt (buttocks). We recommend using one that fits in the toilet, to help with ease of use and cleanliness. It may be used for either healing or cleaning purposes. Sitz baths are also used to relieve pain, itching, or muscle tightening (spasms). The water may contain medicine. Moist heat will help you heal and relax.  HOME CARE  Take 3 to 4 sitz baths a day. Fill the bathtub half-full with warm water. Sit in the water and open the drain a little. Turn on the warm water to keep the tub half-full. Keep the water running constantly. Soak in the water for 15 to 20 minutes. After the sitz bath, pat the affected area dry. GET HELP RIGHT AWAY IF: You get worse instead of better. Stop the sitz baths if you get worse. MAKE SURE YOU: Understand these instructions. Will watch your condition. Will get help right away if you are not doing well or get worse. Document Released: 01/13/2005 Document Revised: 08/30/2012 Document Reviewed: 04/05/2011 [employer] Patient Information 2015 [employer]. This information is not intended to replace advice given to you by your health care provider. Make sure you discuss any questions you have with your health care provider.     

## 2023-03-01 NOTE — Lactation Note (Signed)
This note was copied from a baby's chart. Lactation Consultation Note  Patient Name: Natalie Munoz [id number] Date: 03/01/2023 Age:21 hours Reason for consult: Follow-up assessment   Maternal Data Has patient been taught Hand Expression?: Yes Does the patient have breastfeeding experience prior to this delivery?: No  P2 mom preparing for discharge. Says that BF is going well, no questions. "I'm good".  Feeding Mother's Current Feeding Choice: Breast Milk and Formula  Baby is breast and bottle fed, tolerating all feedings without any reported concerns. Volumes of formula is up to 42m post BF.  Encouraged mom to continue to offer the breast first at each feeding to optimize milk removal and promote a good milk supply. Reviewed signs that baby is full after BF or needing more and offering formula.   LATCH Score   Baby did not feed during this visit.  Lactation Tools Discussed/Used    Interventions Interventions: Breast feeding basics reviewed;DEBP;Ice;Education;Pace feeding  Reviewed feeding patterns, newborn feeding behaviors. Signs of contentment after feeding. Reviewed pace feeding to help prevent over feeding and spit-up.   Discharge Discharge Education: Engorgement and breast care;Warning signs for feeding baby;Outpatient recommendation Pump: Personal  Guidance given for anticipated breast changes; management of breast fullness/engorgement. Ice for swelling.  Consult Status Consult Status: Complete  Outpatient lactation number given. Encouraged to call lactation department with questions and ongoing BF support.  SLavonia Drafts3/11/2023, 9:34 AM

## 2023-03-01 NOTE — Progress Notes (Signed)
Patient discharged home with infant. Discharge instructions and prescriptions given and reviewed with patient. Patient verbalized understanding. Escorted out by volunteer.  

## 2023-04-06 ENCOUNTER — Telehealth: Payer: Self-pay

## 2023-04-06 NOTE — Telephone Encounter (Signed)
[facility]- Discharge Call Backs- Spoke to patient on the phone about the following below. 1-Do you have any questions or concerns about yourself as you heal?No 2-Any concerns or questions about your baby?No 3-Reviewed ABC's of safe sleep. 4-How was your stay at the hospital?Good 5- Did our team work together to care for you?Yes You should be receiving a survey in the mail soon.   We would really appreciate it if you could fill that out for us and return it in the mail.  We value the feedback to make improvements and continue the great work we do.   If you have any questions please feel free to call me back at [phone]  

## 2024-03-31 DIAGNOSIS — R221 Localized swelling, mass and lump, neck: Secondary | ICD-10-CM | POA: Insufficient documentation

## 2024-04-01 ENCOUNTER — Emergency Department
Admission: EM | Admit: 2024-04-01 | Discharge: 2024-04-01 | Disposition: A | Discharge status: Home / Self Care | Attending: Emergency Medicine | Admitting: Emergency Medicine

## 2024-04-01 ENCOUNTER — Encounter: Payer: Self-pay | Admitting: Emergency Medicine

## 2024-04-01 ENCOUNTER — Emergency Department

## 2024-04-01 ENCOUNTER — Other Ambulatory Visit: Payer: Self-pay

## 2024-04-01 DIAGNOSIS — R221 Localized swelling, mass and lump, neck: Secondary | ICD-10-CM

## 2024-04-01 LAB — BASIC METABOLIC PANEL WITH GFR
Anion gap: 12 (ref 5–15)
BUN: 12 mg/dL (ref 6–20)
CO2: 22 mmol/L (ref 22–32)
Calcium: 9.2 mg/dL (ref 8.9–10.3)
Chloride: 100 mmol/L (ref 98–111)
Creatinine, Ser: 0.7 mg/dL (ref 0.44–1.00)
GFR, Estimated: >60 mL/min (ref >60)
Glucose, Bld: 97 mg/dL (ref 70–99)
Potassium: 3.3 mmol/L — ABNORMAL LOW (ref 3.5–5.1)
Sodium: 134 mmol/L — ABNORMAL LOW (ref 135–145)

## 2024-04-01 LAB — CBC WITH DIFFERENTIAL/PLATELET
Abs Immature Granulocytes: 0.03 10*3/uL (ref 0.00–0.07)
Basophils Absolute: 0 10*3/uL (ref 0.0–0.1)
Basophils Relative: 0 %
Eosinophils Absolute: 0.2 10*3/uL (ref 0.0–0.5)
Eosinophils Relative: 2 %
HCT: 43.5 % (ref 36.0–46.0)
Hemoglobin: 14.7 g/dL (ref 12.0–15.0)
Immature Granulocytes: 0 %
Lymphocytes Relative: 29 %
Lymphs Abs: 2.4 10*3/uL (ref 0.7–4.0)
MCH: 29.1 pg (ref 26.0–34.0)
MCHC: 33.8 g/dL (ref 30.0–36.0)
MCV: 86 fL (ref 80.0–100.0)
Monocytes Absolute: 0.9 10*3/uL (ref 0.1–1.0)
Monocytes Relative: 11 %
Neutro Abs: 4.7 10*3/uL (ref 1.7–7.7)
Neutrophils Relative %: 58 %
Platelets: 308 10*3/uL (ref 150–400)
RBC: 5.06 MIL/uL (ref 3.87–5.11)
RDW: 12.4 % (ref 11.5–15.5)
WBC: 8.2 10*3/uL (ref 4.0–10.5)
nRBC: 0 % (ref 0.0–0.2)

## 2024-04-01 MED ORDER — SODIUM CHLORIDE 0.9 % IV SOLN
3.0000 g | INTRAVENOUS | Status: AC
  Administered 2024-04-01: 3 g via INTRAVENOUS
  Filled 2024-04-01: qty 8

## 2024-04-01 MED ORDER — IOHEXOL 300 MG/ML  SOLN
75.0000 mL | Freq: Once | INTRAMUSCULAR | Status: AC | PRN
  Administered 2024-04-01: 75 mL via INTRAVENOUS

## 2024-04-01 MED ORDER — DEXAMETHASONE SODIUM PHOSPHATE 10 MG/ML IJ SOLN
10.0000 mg | Freq: Once | INTRAMUSCULAR | Status: AC
  Administered 2024-04-01: 10 mg via INTRAVENOUS
  Filled 2024-04-01: qty 1

## 2024-04-01 MED ORDER — AMOXICILLIN-POT CLAVULANATE 875-125 MG PO TABS: 1.0000 | ORAL_TABLET | Freq: Two times a day (BID) | ORAL | 0 refills | Status: AC

## 2024-04-01 MED ORDER — PREDNISONE 20 MG PO TABS: 60.0000 mg | ORAL_TABLET | Freq: Every day | ORAL | 0 refills | Status: AC

## 2024-04-01 NOTE — ED Provider Notes (Signed)
 [facility] Provider Note    Event Date/Time   First MD Initiated Contact with Patient 04/01/24 [phone]     (approximate)   History   Mass   HPI Natalie Munoz is a 23 y.o. female who presents for evaluation of swelling beneath her chin and of her neck.  She says that she first noticed it this morning when she got up and it is steadily gotten worse over the course of the day.  Now it it is painful for her when she tries to swallow.  She is not having any trouble breathing.  She also has a smaller lump on her chin.  She states that she has not had any recent injury and has no dental problems of which she is aware.  She had a spot next to the corner of her the left side of her mouth it was itchy yesterday which she scratched and it bled a little bit and scabbed over, but it was not a pimple and did not seem to be infected.  That was the day before, and then she noticed the swelling beneath her chin as of yesterday morning (less than 24 hours ago).  No similar issues in the past.  No recent medications.  No alcohol or drug use.     Physical Exam   Triage Vital Signs: ED Triage Vitals  Encounter Vitals Group     BP 03/31/24 2358 127/86     Systolic BP Percentile --      Diastolic BP Percentile --      Pulse Rate 03/31/24 2358 (!) 105     Resp 03/31/24 2358 18     Temp 03/31/24 2358 98.4 F (36.9 C)     Temp Source 03/31/24 2358 Oral     SpO2 03/31/24 2358 99 %     Weight 04/01/24 0000 99.3 kg (219 lb)     Height 04/01/24 0000 1.549 m (5\' 1" )     Head Circumference --      Peak Flow --      Pain Score 04/01/24 0000 7     Pain Loc --      Pain Education --      Exclude from Growth Chart --     Most recent vital signs: Vitals:   03/31/24 2358  BP: 127/86  Pulse: (!) 105  Resp: 18  Temp: 98.4 F (36.9 C)  SpO2: 99%    General: Awake, no distress.  HEENT: Easily visible submental and submandibular swelling, mostly in the middle but slightly  left-sided.  Tender to palpation and firm.  Patient has an excoriated lesion to the left side of her face just lateral to the corner of her mouth as described in the HPI, it does not appear consistent with abscess but is an excoriated and dried/crusty lesion.  There are no obvious dental lesions.  Beneath her tongue is soft and not consistent with Ludwig's angina at least within the oropharynx.  She has no trismus.  No tenderness along any of the teeth or gum lines.  No peritonsillar swelling or erythema. CV:  Good peripheral perfusion.  Mild tachycardia. Resp:  Normal effort. Speaking easily and comfortably, no accessory muscle usage nor intercostal retractions.   Abd:  No distention.    ED Results / Procedures / Treatments   Labs (all labs ordered are listed, but only abnormal results are displayed) Labs Reviewed  BASIC METABOLIC PANEL WITH GFR - Abnormal; Notable for the following components:  Result Value   Sodium 134 (*)    Potassium 3.3 (*)    All other components within normal limits  CBC WITH DIFFERENTIAL/PLATELET     RADIOLOGY See ED course for details regarding CT soft tissue neck   PROCEDURES:  Critical Care performed: No  Procedures    IMPRESSION / MDM / ASSESSMENT AND PLAN / ED COURSE  I reviewed the triage vital signs and the nursing notes.                              Differential diagnosis includes, but is not limited to, Ludwig's angina, deep neck space infection, sialoadenitis, nonspecific lymphadenopathy or lymphadenitis.  Patient's presentation is most consistent with acute presentation with potential threat to life or bodily function.  Labs/studies ordered: BMP, CBC with differential, CT soft tissue neck with contrast  Interventions/Medications given:  Medications  dexamethasone (DECADRON) injection 10 mg (10 mg Intravenous Given 04/01/24 0214)  Ampicillin-Sulbactam (UNASYN) 3 g in sodium chloride 0.9 % 100 mL IVPB (0 g Intravenous Stopped  04/01/24 0244)  iohexol (OMNIPAQUE) 300 MG/ML solution 75 mL (75 mLs Intravenous Contrast Given 04/01/24 0249)    (Note:  hospital course my include additional interventions and/or labs/studies not listed above.)   Relatively rapid submandibular and submental swelling concerning for developing deep neck space infection which could compromise her airway.  No indication for emergent airway intervention at this time but I am ordering Decadron 10 mg IV and Unasyn 3 g IV to treat empirically until I can prove otherwise that this is not an infectious process.  Basic lab work pending but can proceed with CT.  Patient agrees with the plan.     Clinical Course as of 04/01/24 0330  Sun Apr 01, 2024  0324 CT Soft Tissue Neck W Contrast I personally viewed and interpreted the patient's CT soft tissue neck.  While there is swelling, I see no drainable fluid collection.  The radiologist confirmed infectious versus inflammatory soft tissue changes but no emergent or critical findings and no phlegmon or abscess. [CF]  [id number] I updated the patient about the results.  She was able to eat a meal in the interim since I last saw her.  No worsening.  She is comfortable with the plan for empiric antibiotics and a course of prednisone and outpatient follow-up.  I gave strict return precautions should she develop new or worsening symptoms. [CF]    Clinical Course User Index [CF] Lynnda Sas, MD     FINAL CLINICAL IMPRESSION(S) / ED DIAGNOSES   Final diagnoses:  Neck swelling     Rx / DC Orders   ED Discharge Orders          Ordered    amoxicillin-clavulanate (AUGMENTIN) 875-125 MG tablet  2 times daily        04/01/24 0326    predniSONE (DELTASONE) 20 MG tablet  Daily with breakfast        04/01/24 0326             Note:  This document was prepared using Dragon voice recognition software and may include unintentional dictation errors.   Lynnda Sas, MD 04/01/24 0330

## 2024-04-01 NOTE — Discharge Instructions (Signed)
 Your evaluation was generally reassuring.  It appears that you have some inflammation or possibly soft tissue infection of the neck that is causing the swelling.  Please take the full course of antibiotics prescribed (10 days of Augmentin) and the full 5 days of prednisone that was prescribed to help with the swelling and inflammation.  You can also take over-the-counter Tylenol as needed for discomfort.  Once you are no longer taking the prednisone, it is also safe to take over-the-counter ibuprofen.  Return immediately to the emergency department if you develop new or worsening symptoms that concern you, such as fever, difficulty swallowing, difficulty breathing, etc.  You can follow-up with your primary care doctor or with an ENT specialist at the number listed in this paperwork.

## 2024-04-01 NOTE — ED Triage Notes (Signed)
 Pt presents to the ED via POV with complaints of a bump under her chin that developed yesterday and a new bump to the L side of her chin that developed tonight. She endorses tenderness with palpation and took Tylenol PTA with minimal improvement. Airway patent. A&Ox4 at this time. Denies CP or SOB.
